# Patient Record
Sex: Female | Born: 1965 | Race: White | Hispanic: No | Marital: Married | Smoking: Never smoker
Health system: Southern US, Community
[De-identification: ages and names within clinical notes are randomized; demographics above are authoritative.]

## PROBLEM LIST (undated history)

## (undated) DIAGNOSIS — L709 Acne, unspecified: Secondary | ICD-10-CM

## (undated) DIAGNOSIS — M1990 Unspecified osteoarthritis, unspecified site: Secondary | ICD-10-CM

## (undated) HISTORY — PX: KNEE ARTHROSCOPY: SUR90

## (undated) HISTORY — PX: ABDOMINAL HYSTERECTOMY: SHX81

---

## 2007-05-12 ENCOUNTER — Ambulatory Visit: Payer: Self-pay | Admitting: Family Medicine

## 2007-05-24 ENCOUNTER — Ambulatory Visit: Payer: Self-pay | Admitting: Family Medicine

## 2007-11-25 ENCOUNTER — Ambulatory Visit: Payer: Self-pay | Admitting: Family Medicine

## 2008-05-25 ENCOUNTER — Ambulatory Visit: Payer: Self-pay | Admitting: Family Medicine

## 2009-08-22 ENCOUNTER — Ambulatory Visit: Payer: Self-pay | Admitting: Family Medicine

## 2010-10-28 ENCOUNTER — Ambulatory Visit: Payer: Self-pay | Admitting: Family Medicine

## 2011-01-06 ENCOUNTER — Ambulatory Visit: Payer: Self-pay | Admitting: Obstetrics and Gynecology

## 2011-01-12 ENCOUNTER — Inpatient Hospital Stay: Payer: Self-pay | Admitting: Obstetrics and Gynecology

## 2011-01-15 LAB — PATHOLOGY REPORT

## 2012-01-13 ENCOUNTER — Ambulatory Visit: Payer: Self-pay | Admitting: Family Medicine

## 2013-01-16 ENCOUNTER — Ambulatory Visit: Payer: Self-pay | Admitting: Family Medicine

## 2014-03-27 ENCOUNTER — Ambulatory Visit: Payer: Self-pay | Admitting: Family Medicine

## 2014-10-01 ENCOUNTER — Encounter: Payer: Self-pay | Admitting: *Deleted

## 2014-10-01 ENCOUNTER — Ambulatory Visit
Admission: RE | Admit: 2014-10-01 | Discharge: 2014-10-01 | Disposition: A | Discharge status: Home / Self Care | Payer: No Typology Code available for payment source | Source: Ambulatory Visit | Attending: Unknown Physician Specialty | Admitting: Unknown Physician Specialty

## 2014-10-01 ENCOUNTER — Ambulatory Visit: Payer: No Typology Code available for payment source | Admitting: *Deleted

## 2014-10-01 ENCOUNTER — Encounter
Admission: RE | Disposition: A | Discharge status: Home / Self Care | Payer: Self-pay | Source: Ambulatory Visit | Attending: Unknown Physician Specialty

## 2014-10-01 DIAGNOSIS — D123 Benign neoplasm of transverse colon: Secondary | ICD-10-CM | POA: Diagnosis not present

## 2014-10-01 DIAGNOSIS — Z79899 Other long term (current) drug therapy: Secondary | ICD-10-CM | POA: Insufficient documentation

## 2014-10-01 DIAGNOSIS — Z1211 Encounter for screening for malignant neoplasm of colon: Secondary | ICD-10-CM | POA: Insufficient documentation

## 2014-10-01 DIAGNOSIS — D124 Benign neoplasm of descending colon: Secondary | ICD-10-CM | POA: Diagnosis not present

## 2014-10-01 DIAGNOSIS — K640 First degree hemorrhoids: Secondary | ICD-10-CM | POA: Insufficient documentation

## 2014-10-01 HISTORY — PX: COLONOSCOPY WITH PROPOFOL: SHX5780

## 2014-10-01 SURGERY — COLONOSCOPY WITH PROPOFOL
Anesthesia: General

## 2014-10-01 MED ORDER — SODIUM CHLORIDE 0.9 % IV SOLN: INTRAVENOUS | Status: DC

## 2014-10-01 MED ORDER — LIDOCAINE HCL 1 % IJ SOLN
INTRAMUSCULAR | Status: DC | PRN
  Administered 2014-10-01: 50 mg via INTRADERMAL

## 2014-10-01 MED ORDER — PROPOFOL 10 MG/ML IV BOLUS
INTRAVENOUS | Status: DC | PRN
  Administered 2014-10-01 (×2): 50 mg via INTRAVENOUS

## 2014-10-01 MED ORDER — SODIUM CHLORIDE 0.9 % IV SOLN
INTRAVENOUS | Status: DC | PRN
  Administered 2014-10-01: 16:00:00 via INTRAVENOUS

## 2014-10-01 MED ORDER — PROPOFOL INFUSION 10 MG/ML OPTIME
INTRAVENOUS | Status: DC | PRN
  Administered 2014-10-01: 200 ug/kg/min via INTRAVENOUS

## 2014-10-01 NOTE — Anesthesia Postprocedure Evaluation (Signed)
  Anesthesia Post-op Note  Patient: Danielle Berry  Procedure(s) Performed: Procedure(s): COLONOSCOPY WITH PROPOFOL (N/A)  Anesthesia type:General  Patient location: PACU  Post pain: Pain level controlled  Post assessment: Post-op Vital signs reviewed, Patient's Cardiovascular Status Stable, Respiratory Function Stable, Patent Airway and No signs of Nausea or vomiting  Post vital signs: Reviewed and stable  Last Vitals:  Filed Vitals:   10/01/14 1740  BP: 112/72  Pulse: 56  Temp:   Resp: 10    Level of consciousness: awake, alert  and patient cooperative  Complications: No apparent anesthesia complications

## 2014-10-01 NOTE — H&P (Signed)
Primary Care Physician:  Juluis Pitch, MD Primary Gastroenterologist:  Dr. Vira Agar  Pre-Procedure History & Physical: HPI:  BEMNET TROVATO is a 49 y.o. female is here for an colonoscopy.   History reviewed. No pertinent past medical history.  History reviewed. No pertinent past surgical history.  Prior to Admission medications   Medication Sig Start Date End Date Taking? Authorizing Provider  cetirizine (ZYRTEC) 10 MG tablet Take 10 mg by mouth daily.   Yes Historical Provider, MD  doxycycline (VIBRAMYCIN) 100 MG capsule Take 100 mg by mouth 2 (two) times daily.   Yes Historical Provider, MD  spironolactone (ALDACTONE) 100 MG tablet Take 100 mg by mouth daily.   Yes Historical Provider, MD  tretinoin (RETIN-A) 0.025 % cream Apply topically at bedtime.   Yes Historical Provider, MD    Allergies as of 09/04/2014  . (Not on File)    History reviewed. No pertinent family history.  History   Social History  . Marital Status: Married    Spouse Name: N/A  . Number of Children: N/A  . Years of Education: N/A   Occupational History  . Not on file.   Social History Main Topics  . Smoking status: Not on file  . Smokeless tobacco: Not on file  . Alcohol Use: Not on file  . Drug Use: Not on file  . Sexual Activity: Not on file   Other Topics Concern  . Not on file   Social History Narrative  . No narrative on file    Review of Systems: See HPI, otherwise negative ROS  Physical Exam: BP 122/80 mmHg  Pulse 57  Temp(Src) 98.6 F (37 C) (Tympanic)  Resp 14  Ht 6' (1.829 m)  Wt 81.647 kg (180 lb)  BMI 24.41 kg/m2  SpO2 100% General:   Alert,  pleasant and cooperative in NAD Head:  Normocephalic and atraumatic. Neck:  Supple; no masses or thyromegaly. Lungs:  Clear throughout to auscultation.    Heart:  Regular rate and rhythm. Abdomen:  Soft, nontender and nondistended. Normal bowel sounds, without guarding, and without rebound.   Neurologic:  Alert and   oriented x4;  grossly normal neurologically.  Impression/Plan: HARMONI LUCUS is here for an colonoscopy to be performed for screening colonoscopy  Risks, benefits, limitations, and alternatives regarding  colonoscopy have been reviewed with the patient.  Questions have been answered.  All parties agreeable.   Gaylyn Cheers, MD  10/01/2014, 4:29 PM   Primary Care Physician:  Juluis Pitch, MD Primary Gastroenterologist:  Dr. Vira Agar  Pre-Procedure History & Physical: HPI:  JAYLANI MCGUINN is a 49 y.o. female is here for an colonoscopy.   History reviewed. No pertinent past medical history.  History reviewed. No pertinent past surgical history.  Prior to Admission medications   Medication Sig Start Date End Date Taking? Authorizing Provider  cetirizine (ZYRTEC) 10 MG tablet Take 10 mg by mouth daily.   Yes Historical Provider, MD  doxycycline (VIBRAMYCIN) 100 MG capsule Take 100 mg by mouth 2 (two) times daily.   Yes Historical Provider, MD  spironolactone (ALDACTONE) 100 MG tablet Take 100 mg by mouth daily.   Yes Historical Provider, MD  tretinoin (RETIN-A) 0.025 % cream Apply topically at bedtime.   Yes Historical Provider, MD    Allergies as of 09/04/2014  . (Not on File)    History reviewed. No pertinent family history.  History   Social History  . Marital Status: Married    Spouse Name: N/A  .  Number of Children: N/A  . Years of Education: N/A   Occupational History  . Not on file.   Social History Main Topics  . Smoking status: Not on file  . Smokeless tobacco: Not on file  . Alcohol Use: Not on file  . Drug Use: Not on file  . Sexual Activity: Not on file   Other Topics Concern  . Not on file   Social History Narrative  . No narrative on file    Review of Systems: See HPI, otherwise negative ROS  Physical Exam: BP 122/80 mmHg  Pulse 57  Temp(Src) 98.6 F (37 C) (Tympanic)  Resp 14  Ht 6' (1.829 m)  Wt 81.647 kg (180 lb)  BMI 24.41  kg/m2  SpO2 100% General:   Alert,  pleasant and cooperative in NAD Head:  Normocephalic and atraumatic. Neck:  Supple; no masses or thyromegaly. Lungs:  Clear throughout to auscultation.    Heart:  Regular rate and rhythm. Abdomen:  Soft, nontender and nondistended. Normal bowel sounds, without guarding, and without rebound.   Neurologic:  Alert and  oriented x4;  grossly normal neurologically.  Impression/Plan: AASHKA SALOMONE is here for an colonoscopy to be performed for screening colon.  Risks, benefits, limitations, and alternatives regarding  colonoscopy have been reviewed with the patient.  Questions have been answered.  All parties agreeable.   Gaylyn Cheers, MD  10/01/2014, 4:29 PM

## 2014-10-01 NOTE — Op Note (Signed)
[facility] Gastroenterology Patient Name: Danielle Berry Procedure Date: 10/01/2014 4:16 PM MRN: [id number] Account #: [id number] Date of Birth: 1966-03-03 Admit Type: Outpatient Age: 49 Room: [facility] ENDO ROOM 1 Gender: Female Note Status: Finalized Procedure:         Colonoscopy Indications:       Screening for colorectal malignant neoplasm Providers:         Manya Silvas, MD Referring MD:      Youlanda Roys. Lovie Macadamia, MD (Referring MD) Medicines:         Propofol per Anesthesia Complications:     No immediate complications. Procedure:         Pre-Anesthesia Assessment:                    - After reviewing the risks and benefits, the patient was                     deemed in satisfactory condition to undergo the procedure.                    After obtaining informed consent, the colonoscope was                     passed under direct vision. Throughout the procedure, the                     patient's blood pressure, pulse, and oxygen saturations                     were monitored continuously. The Olympus PCF-H180AL                     colonoscope ( S#: [id number] ) was introduced through the                     anus and advanced to the the cecum, identified by                     appendiceal orifice and ileocecal valve. The colonoscopy                     was performed without difficulty. The patient tolerated                     the procedure well. The quality of the bowel preparation                     was adequate to identify polyps. Findings:      A small polyp was found in the transverse colon. The polyp was sessile.       The polyp was removed with a cold snare. Resection and retrieval were       complete.      A diminutive polyp was found at the hepatic flexure. The polyp was       sessile. The polyp was removed with a jumbo cold forceps. Resection and       retrieval were complete.      A diminutive polyp was found in the transverse colon. The polyp was        sessile. The polyp was removed with a jumbo cold forceps. Resection and       retrieval were complete.      A diminutive polyp was found in the descending colon. The polyp was  sessile. The polyp was removed with a cold snare. Resection and       retrieval were complete.      Internal hemorrhoids were found during endoscopy. The hemorrhoids were       small and Grade I (internal hemorrhoids that do not prolapse). Impression:        - One small polyp in the transverse colon. Resected and                     retrieved.                    - One diminutive polyp at the hepatic flexure. Resected                     and retrieved.                    - One diminutive polyp in the transverse colon. Resected                     and retrieved.                    - One diminutive polyp in the descending colon. Resected                     and retrieved.                    - Internal hemorrhoids. Recommendation:    - Await pathology results.                    - The findings and recommendations were discussed with the                     patient's family. Manya Silvas, MD 10/01/2014 5:09:46 PM This report has been signed electronically. Number of Addenda: 0 Note Initiated On: 10/01/2014 4:16 PM Scope Withdrawal Time: 0 hours 16 minutes 26 seconds  Total Procedure Duration: 0 hours 28 minutes 9 seconds       [facility]

## 2014-10-01 NOTE — Transfer of Care (Signed)
Immediate Anesthesia Transfer of Care Note  Patient: Danielle Berry  Procedure(s) Performed: Procedure(s): COLONOSCOPY WITH PROPOFOL (N/A)  Patient Location: PACU  Anesthesia Type:General  Level of Consciousness: awake, alert  and oriented  Airway & Oxygen Therapy: Patient Spontanous Breathing and Patient connected to nasal cannula oxygen  Post-op Assessment: Report given to RN and Post -op Vital signs reviewed and stable  Post vital signs: Reviewed and stable  Last Vitals:  Filed Vitals:   10/01/14 1711  BP: 107/53  Pulse: 65  Temp: 35.7 C  Resp: 14    Complications: No apparent anesthesia complications

## 2014-10-01 NOTE — Anesthesia Preprocedure Evaluation (Signed)
Anesthesia Evaluation  Patient identified by MRN, date of birth, ID band Patient awake    Reviewed: Allergy & Precautions, NPO status , Patient's Chart, lab work & pertinent test results  Airway Mallampati: I  TM Distance: >3 FB Neck ROM: Full    Dental  (+) Teeth Intact   Pulmonary    Pulmonary exam normal       Cardiovascular Exercise Tolerance: Good negative cardio ROS Normal cardiovascular exam    Neuro/Psych    GI/Hepatic   Endo/Other    Renal/GU      Musculoskeletal   Abdominal Normal abdominal exam  (+)   Peds  Hematology   Anesthesia Other Findings   Reproductive/Obstetrics                             Anesthesia Physical Anesthesia Plan  ASA: II  Anesthesia Plan: General   Post-op Pain Management:    Induction: Intravenous  Airway Management Planned: Nasal Cannula  Additional Equipment:   Intra-op Plan:   Post-operative Plan:   Informed Consent: I have reviewed the patients History and Physical, chart, labs and discussed the procedure including the risks, benefits and alternatives for the proposed anesthesia with the patient or authorized representative who has indicated his/her understanding and acceptance.     Plan Discussed with: CRNA  Anesthesia Plan Comments:         Anesthesia Quick Evaluation

## 2014-10-03 LAB — SURGICAL PATHOLOGY

## 2014-10-08 ENCOUNTER — Encounter: Payer: Self-pay | Admitting: Unknown Physician Specialty

## 2015-05-31 ENCOUNTER — Other Ambulatory Visit: Payer: Self-pay | Admitting: Obstetrics and Gynecology

## 2015-05-31 DIAGNOSIS — N6459 Other signs and symptoms in breast: Secondary | ICD-10-CM

## 2015-06-14 ENCOUNTER — Ambulatory Visit
Admission: RE | Admit: 2015-06-14 | Discharge: 2015-06-14 | Disposition: A | Discharge status: Home / Self Care | Payer: BLUE CROSS/BLUE SHIELD | Source: Ambulatory Visit | Attending: Obstetrics and Gynecology | Admitting: Obstetrics and Gynecology

## 2015-06-14 ENCOUNTER — Ambulatory Visit: Payer: No Typology Code available for payment source

## 2015-06-14 DIAGNOSIS — N6459 Other signs and symptoms in breast: Secondary | ICD-10-CM | POA: Insufficient documentation

## 2017-01-08 ENCOUNTER — Other Ambulatory Visit: Payer: Self-pay | Admitting: Obstetrics and Gynecology

## 2017-01-08 ENCOUNTER — Other Ambulatory Visit: Payer: Self-pay | Admitting: Family Medicine

## 2017-01-08 DIAGNOSIS — Z1231 Encounter for screening mammogram for malignant neoplasm of breast: Secondary | ICD-10-CM

## 2017-02-01 ENCOUNTER — Ambulatory Visit
Admission: RE | Admit: 2017-02-01 | Discharge: 2017-02-01 | Disposition: A | Discharge status: Home / Self Care | Payer: BLUE CROSS/BLUE SHIELD | Source: Ambulatory Visit | Attending: Family Medicine | Admitting: Family Medicine

## 2017-02-01 DIAGNOSIS — Z1231 Encounter for screening mammogram for malignant neoplasm of breast: Secondary | ICD-10-CM | POA: Insufficient documentation

## 2018-03-24 ENCOUNTER — Other Ambulatory Visit: Payer: Self-pay | Admitting: Family Medicine

## 2018-03-24 DIAGNOSIS — Z1231 Encounter for screening mammogram for malignant neoplasm of breast: Secondary | ICD-10-CM

## 2018-04-14 ENCOUNTER — Ambulatory Visit
Admission: RE | Admit: 2018-04-14 | Discharge: 2018-04-14 | Disposition: A | Discharge status: Home / Self Care | Payer: PRIVATE HEALTH INSURANCE | Source: Ambulatory Visit | Attending: Family Medicine | Admitting: Family Medicine

## 2018-04-14 DIAGNOSIS — Z1231 Encounter for screening mammogram for malignant neoplasm of breast: Secondary | ICD-10-CM

## 2019-05-23 ENCOUNTER — Other Ambulatory Visit: Payer: Self-pay | Admitting: Family Medicine

## 2019-05-23 DIAGNOSIS — Z1231 Encounter for screening mammogram for malignant neoplasm of breast: Secondary | ICD-10-CM

## 2019-05-25 ENCOUNTER — Ambulatory Visit
Admission: RE | Admit: 2019-05-25 | Discharge: 2019-05-25 | Disposition: A | Discharge status: Home / Self Care | Payer: 59 | Source: Ambulatory Visit | Attending: Family Medicine | Admitting: Family Medicine

## 2019-05-25 DIAGNOSIS — Z1231 Encounter for screening mammogram for malignant neoplasm of breast: Secondary | ICD-10-CM | POA: Diagnosis present

## 2020-05-15 ENCOUNTER — Other Ambulatory Visit: Payer: Self-pay | Admitting: Family Medicine

## 2020-05-15 DIAGNOSIS — Z1231 Encounter for screening mammogram for malignant neoplasm of breast: Secondary | ICD-10-CM

## 2020-06-03 ENCOUNTER — Other Ambulatory Visit: Payer: Self-pay

## 2020-06-03 ENCOUNTER — Ambulatory Visit
Admission: RE | Admit: 2020-06-03 | Discharge: 2020-06-03 | Disposition: A | Discharge status: Home / Self Care | Payer: 59 | Source: Ambulatory Visit | Attending: Family Medicine | Admitting: Family Medicine

## 2020-06-03 DIAGNOSIS — Z1231 Encounter for screening mammogram for malignant neoplasm of breast: Secondary | ICD-10-CM | POA: Diagnosis present

## 2021-05-07 ENCOUNTER — Other Ambulatory Visit: Payer: Self-pay | Admitting: Family Medicine

## 2021-05-07 DIAGNOSIS — Z1231 Encounter for screening mammogram for malignant neoplasm of breast: Secondary | ICD-10-CM

## 2021-06-13 ENCOUNTER — Ambulatory Visit
Admission: RE | Admit: 2021-06-13 | Discharge: 2021-06-13 | Disposition: A | Discharge status: Home / Self Care | Payer: 59 | Source: Ambulatory Visit | Attending: Family Medicine | Admitting: Family Medicine

## 2021-06-13 DIAGNOSIS — Z1231 Encounter for screening mammogram for malignant neoplasm of breast: Secondary | ICD-10-CM | POA: Diagnosis not present

## 2021-06-24 DIAGNOSIS — Z13220 Encounter for screening for lipoid disorders: Secondary | ICD-10-CM | POA: Diagnosis not present

## 2021-06-24 DIAGNOSIS — Z0000 Encounter for general adult medical examination without abnormal findings: Secondary | ICD-10-CM | POA: Diagnosis not present

## 2021-06-24 DIAGNOSIS — L700 Acne vulgaris: Secondary | ICD-10-CM | POA: Diagnosis not present

## 2021-06-24 DIAGNOSIS — M8580 Other specified disorders of bone density and structure, unspecified site: Secondary | ICD-10-CM | POA: Diagnosis not present

## 2021-06-25 DIAGNOSIS — Z13220 Encounter for screening for lipoid disorders: Secondary | ICD-10-CM | POA: Diagnosis not present

## 2021-06-25 DIAGNOSIS — M8580 Other specified disorders of bone density and structure, unspecified site: Secondary | ICD-10-CM | POA: Diagnosis not present

## 2021-06-25 DIAGNOSIS — L700 Acne vulgaris: Secondary | ICD-10-CM | POA: Diagnosis not present

## 2022-05-12 ENCOUNTER — Other Ambulatory Visit: Payer: Self-pay | Admitting: Family Medicine

## 2022-05-12 DIAGNOSIS — Z1231 Encounter for screening mammogram for malignant neoplasm of breast: Secondary | ICD-10-CM

## 2022-06-15 ENCOUNTER — Ambulatory Visit
Admission: RE | Admit: 2022-06-15 | Discharge: 2022-06-15 | Disposition: A | Discharge status: Home / Self Care | Payer: 59 | Source: Ambulatory Visit | Attending: Family Medicine | Admitting: Family Medicine

## 2022-06-15 DIAGNOSIS — Z1231 Encounter for screening mammogram for malignant neoplasm of breast: Secondary | ICD-10-CM | POA: Insufficient documentation

## 2023-02-04 ENCOUNTER — Encounter: Payer: Self-pay | Admitting: Internal Medicine

## 2023-02-23 ENCOUNTER — Ambulatory Visit
Admission: RE | Admit: 2023-02-23 | Discharge: 2023-02-23 | Disposition: A | Discharge status: Home / Self Care | Payer: 59 | Attending: Internal Medicine | Admitting: Internal Medicine

## 2023-02-23 ENCOUNTER — Encounter
Admission: RE | Disposition: A | Discharge status: Home / Self Care | Payer: Self-pay | Source: Home / Self Care | Attending: Internal Medicine

## 2023-02-23 ENCOUNTER — Ambulatory Visit: Payer: 59 | Admitting: Certified Registered"

## 2023-02-23 ENCOUNTER — Encounter: Payer: Self-pay | Admitting: Internal Medicine

## 2023-02-23 DIAGNOSIS — Z1211 Encounter for screening for malignant neoplasm of colon: Secondary | ICD-10-CM | POA: Insufficient documentation

## 2023-02-23 DIAGNOSIS — Z860101 Personal history of adenomatous and serrated colon polyps: Secondary | ICD-10-CM | POA: Diagnosis not present

## 2023-02-23 DIAGNOSIS — K640 First degree hemorrhoids: Secondary | ICD-10-CM | POA: Diagnosis not present

## 2023-02-23 HISTORY — DX: Acne, unspecified: L70.9

## 2023-02-23 HISTORY — PX: COLONOSCOPY WITH PROPOFOL: SHX5780

## 2023-02-23 HISTORY — DX: Unspecified osteoarthritis, unspecified site: M19.90

## 2023-02-23 SURGERY — COLONOSCOPY WITH PROPOFOL
Anesthesia: General

## 2023-02-23 MED ORDER — SODIUM CHLORIDE 0.9 % IV SOLN: INTRAVENOUS | Status: DC | PRN

## 2023-02-23 MED ORDER — PROPOFOL 10 MG/ML IV BOLUS
INTRAVENOUS | Status: DC | PRN
  Administered 2023-02-23: 50 mg via INTRAVENOUS

## 2023-02-23 MED ORDER — PROPOFOL 1000 MG/100ML IV EMUL
INTRAVENOUS | Status: AC
  Filled 2023-02-23: qty 100

## 2023-02-23 MED ORDER — PROPOFOL 500 MG/50ML IV EMUL
INTRAVENOUS | Status: DC | PRN
  Administered 2023-02-23: 150 ug/kg/min via INTRAVENOUS

## 2023-02-23 NOTE — Transfer of Care (Signed)
Immediate Anesthesia Transfer of Care Note  Patient: Danielle Berry  Procedure(s) Performed: COLONOSCOPY WITH PROPOFOL  Patient Location: PACU  Anesthesia Type:General  Level of Consciousness: awake, alert , and oriented  Airway & Oxygen Therapy: Patient Spontanous Breathing and Patient connected to face mask oxygen  Post-op Assessment: Report given to RN, Post -op Vital signs reviewed and stable, and Patient moving all extremities X 4  Post vital signs: Reviewed and stable  Last Vitals:  Vitals Value Taken Time  BP 83/55   Temp    Pulse 59 02/23/23 1118  Resp 14 02/23/23 1118  SpO2 100 % 02/23/23 1118  Vitals shown include unfiled device data.  Last Pain:  Vitals:   02/23/23 1013  TempSrc: Temporal         Complications: No notable events documented.

## 2023-02-23 NOTE — Interval H&P Note (Signed)
History and Physical Interval Note:  02/23/2023 10:51 AM  Danielle Berry  has presented today for surgery, with the diagnosis of Z86.010 (ICD-10-CM) - History of colon polyps.  The various methods of treatment have been discussed with the patient and family. After consideration of risks, benefits and other options for treatment, the patient has consented to  Procedure(s): COLONOSCOPY WITH PROPOFOL (N/A) as a surgical intervention.  The patient's history has been reviewed, patient examined, no change in status, stable for surgery.  I have reviewed the patient's chart and labs.  Questions were answered to the patient's satisfaction.     [place], [place]

## 2023-02-23 NOTE — Op Note (Signed)
[facility] Gastroenterology Patient Name: Danielle Berry Procedure Date: 02/23/2023 10:58 AM MRN: [id number] Account #: [id number] Date of Birth: [birth year] Admit Type: Outpatient Age: 57 Room: [facility] ENDO ROOM 1 Gender: Female Note Status: Finalized Instrument Name: Prentice Docker [id number] Procedure:             Colonoscopy Indications:           High risk colon cancer surveillance: Personal history                         of non-advanced adenoma Providers:             Boykin Nearing. Winry Egnew MD, MD Medicines:             Propofol per Anesthesia Complications:         No immediate complications. Estimated blood loss: None. Procedure:             Pre-Anesthesia Assessment:                        - The risks and benefits of the procedure and the                         sedation options and risks were discussed with the                         patient. All questions were answered and informed                         consent was obtained.                        - Patient identification and proposed procedure were                         verified prior to the procedure by the nurse. The                         procedure was verified in the pre-procedure area in                         the procedure room.                        - ASA Grade Assessment: III - A patient with severe                         systemic disease.                        - After reviewing the risks and benefits, the patient                         was deemed in satisfactory condition to undergo the                         procedure.                        After obtaining informed consent, the colonoscope was  passed under direct vision. Throughout the procedure,                         the patient's blood pressure, pulse, and oxygen                         saturations were monitored continuously. The                         Colonoscope was introduced through the anus and                          advanced to the the cecum, identified by appendiceal                         orifice and ileocecal valve. The colonoscopy was                         performed without difficulty. The patient tolerated                         the procedure well. The quality of the bowel                         preparation was good. The ileocecal valve, appendiceal                         orifice, and rectum were photographed. Findings:      The perianal and digital rectal examinations were normal. Pertinent       negatives include normal sphincter tone and no palpable rectal lesions.      Non-bleeding internal hemorrhoids were found during retroflexion. The       hemorrhoids were Grade I (internal hemorrhoids that do not prolapse).      The exam was otherwise without abnormality. Impression:            - Non-bleeding internal hemorrhoids.                        - The examination was otherwise normal.                        - No specimens collected. Recommendation:        - Patient has a contact number available for                         emergencies. The signs and symptoms of potential                         delayed complications were discussed with the patient.                         Return to normal activities tomorrow. Written                         discharge instructions were provided to the patient.                        - Resume previous diet.                        -  Continue present medications.                        - Repeat colonoscopy in 5 years for screening purposes.                        - Family history of colon polyps in two first degree                         relatives.                        - Return to GI office PRN.                        - The findings and recommendations were discussed with                         the patient. Procedure Code(s):     --- Professional ---                        W0981, Colorectal cancer screening; colonoscopy on                          individual at high risk Diagnosis Code(s):     --- Professional ---                        K64.0, First degree hemorrhoids                        Z86.010, Personal history of colonic polyps CPT copyright 2022 American Medical Association. All rights reserved. The codes documented in this report are preliminary and upon coder review may  be revised to meet current compliance requirements. Stanton Kidney MD, MD 02/23/2023 11:19:47 AM This report has been signed electronically. Number of Addenda: 0 Note Initiated On: 02/23/2023 10:58 AM Scope Withdrawal Time: 0 hours 5 minutes 21 seconds  Total Procedure Duration: 0 hours 11 minutes 18 seconds  Estimated Blood Loss:  Estimated blood loss: none.      [facility]

## 2023-02-23 NOTE — Anesthesia Postprocedure Evaluation (Signed)
Anesthesia Post Note  Patient: Danielle Berry  Procedure(s) Performed: COLONOSCOPY WITH PROPOFOL  Patient location during evaluation: Endoscopy Anesthesia Type: General Level of consciousness: awake and alert Pain management: pain level controlled Vital Signs Assessment: post-procedure vital signs reviewed and stable Respiratory status: spontaneous breathing, nonlabored ventilation, respiratory function stable and patient connected to nasal cannula oxygen Cardiovascular status: blood pressure returned to baseline and stable Postop Assessment: no apparent nausea or vomiting Anesthetic complications: no   No notable events documented.   Last Vitals:  Vitals:   02/23/23 1128 02/23/23 1139  BP: 104/70 115/77  Pulse: 60   Resp: 12 (!) 9  Temp:    SpO2: 100%     Last Pain:  Vitals:   02/23/23 1128  TempSrc:   PainSc: 0-No pain                 Cleda Mccreedy Dayonna Selbe

## 2023-02-23 NOTE — Anesthesia Procedure Notes (Signed)
Procedure Name: MAC Date/Time: 02/23/2023 10:59 AM  Performed by: Nelle Don, CRNAPre-anesthesia Checklist: Patient identified, Emergency Drugs available, Patient being monitored and Suction available Oxygen Delivery Method: Nasal cannula

## 2023-02-23 NOTE — Anesthesia Preprocedure Evaluation (Signed)
Anesthesia Evaluation  Patient identified by MRN, date of birth, ID band Patient awake    Reviewed: Allergy & Precautions, NPO status , Patient's Chart, lab work & pertinent test results  History of Anesthesia Complications Negative for: history of anesthetic complications  Airway Mallampati: II  TM Distance: >3 FB Neck ROM: full    Dental  (+) Chipped   Pulmonary neg pulmonary ROS, neg shortness of breath   Pulmonary exam normal        Cardiovascular Exercise Tolerance: Good (-) angina (-) Orthopnea negative cardio ROS Normal cardiovascular exam     Neuro/Psych negative neurological ROS  negative psych ROS   GI/Hepatic negative GI ROS, Neg liver ROS,neg GERD  ,,  Endo/Other  negative endocrine ROS    Renal/GU negative Renal ROS  negative genitourinary   Musculoskeletal   Abdominal   Peds  Hematology negative hematology ROS (+)   Anesthesia Other Findings Past Medical History: No date: Acne No date: Arthritis  Past Surgical History: No date: ABDOMINAL HYSTERECTOMY 10/01/2014: COLONOSCOPY WITH PROPOFOL; N/A     Comment:  Procedure: COLONOSCOPY WITH PROPOFOL;  Surgeon: Scot Jun, MD;  Location: [facility] ENDOSCOPY;  Service:               Endoscopy;  Laterality: N/A; No date: KNEE ARTHROSCOPY; Right     Reproductive/Obstetrics negative OB ROS                             Anesthesia Physical Anesthesia Plan  ASA: 2  Anesthesia Plan: General   Post-op Pain Management:    Induction: Intravenous  PONV Risk Score and Plan: Propofol infusion and TIVA  Airway Management Planned: Natural Airway and Nasal Cannula  Additional Equipment:   Intra-op Plan:   Post-operative Plan:   Informed Consent: I have reviewed the patients History and Physical, chart, labs and discussed the procedure including the risks, benefits and alternatives for the proposed anesthesia  with the patient or authorized representative who has indicated his/her understanding and acceptance.     Dental Advisory Given  Plan Discussed with: Anesthesiologist, CRNA and Surgeon  Anesthesia Plan Comments: (Patient consented for risks of anesthesia including but not limited to:  - adverse reactions to medications - risk of airway placement if required - damage to eyes, teeth, lips or other oral mucosa - nerve damage due to positioning  - sore throat or hoarseness - Damage to heart, brain, nerves, lungs, other parts of body or loss of life  Patient voiced understanding and assent.)       Anesthesia Quick Evaluation

## 2023-02-23 NOTE — H&P (Signed)
Outpatient short stay form Pre-procedure 02/23/2023 10:50 AM Danielle Berry K. Norma Fredrickson, M.D.  Primary Physician: Dorothey Baseman, M.D.    Reason for visit:  Hx adenomatous colon polyps - 2016  History of present illness: Danielle Berry reports overall well from GI standpoint. She generally is having a bowel movement most days. She denies any issues with significant constipation, diarrhea, melena, rectal bleeding. No frequent lower abdominal pain.     No current facility-administered medications for this encounter.  Medications Prior to Admission  Medication Sig Dispense Refill Last Dose   doxycycline (VIBRAMYCIN) 100 MG capsule Take 100 mg by mouth 2 (two) times daily.   02/22/2023   spironolactone (ALDACTONE) 100 MG tablet Take 100 mg by mouth daily.   02/22/2023   cetirizine (ZYRTEC) 10 MG tablet Take 10 mg by mouth daily.      tretinoin (RETIN-A) 0.025 % cream Apply topically at bedtime.        No Known Allergies   Past Medical History:  Diagnosis Date   Acne    Arthritis     Review of systems:  Otherwise negative.    Physical Exam  Gen: Alert, oriented. Appears stated age.  HEENT: [place]/AT. PERRLA. Lungs: CTA, no wheezes. CV: RR nl S1, S2. Abd: soft, benign, no masses. BS+ Ext: No edema. Pulses 2+    Planned procedures: Proceed with colonoscopy. The patient understands the nature of the planned procedure, indications, risks, alternatives and potential complications including but not limited to bleeding, infection, perforation, damage to internal organs and possible oversedation/side effects from anesthesia. The patient agrees and gives consent to proceed.  Please refer to procedure notes for findings, recommendations and patient disposition/instructions.     Dino Borntreger K. Norma Fredrickson, M.D. Gastroenterology 02/23/2023  10:50 AM

## 2023-09-15 IMAGING — MG MM DIGITAL SCREENING BILAT W/ TOMO AND CAD
6 of 10 series · 6 of 30 positions shown · non-contrast
Comparison: Previous exam(s).

CLINICAL DATA: Screening.

EXAM:
DIGITAL SCREENING BILATERAL MAMMOGRAM WITH TOMOSYNTHESIS AND CAD
TECHNIQUE: Bilateral screening digital craniocaudal and mediolateral oblique
mammograms were obtained. Bilateral screening digital breast
tomosynthesis was performed. The images were evaluated with
computer-aided detection.

[R CC synth-2D]
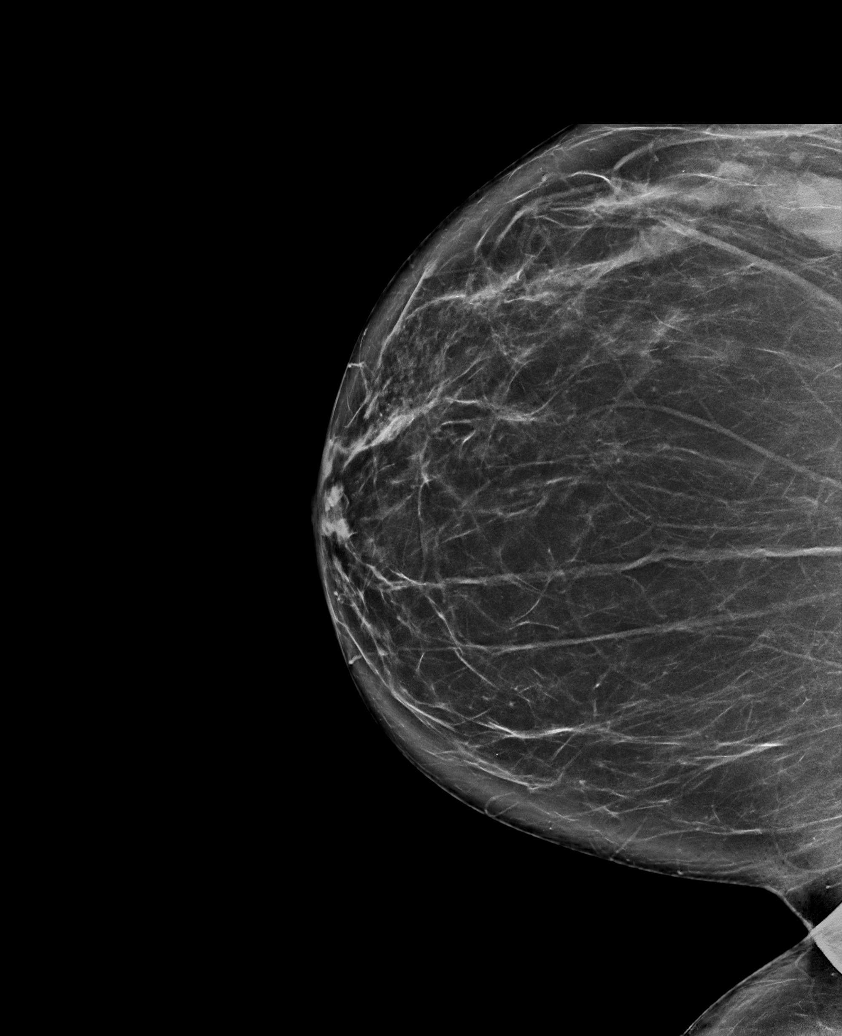

[L MLO synth-2D]
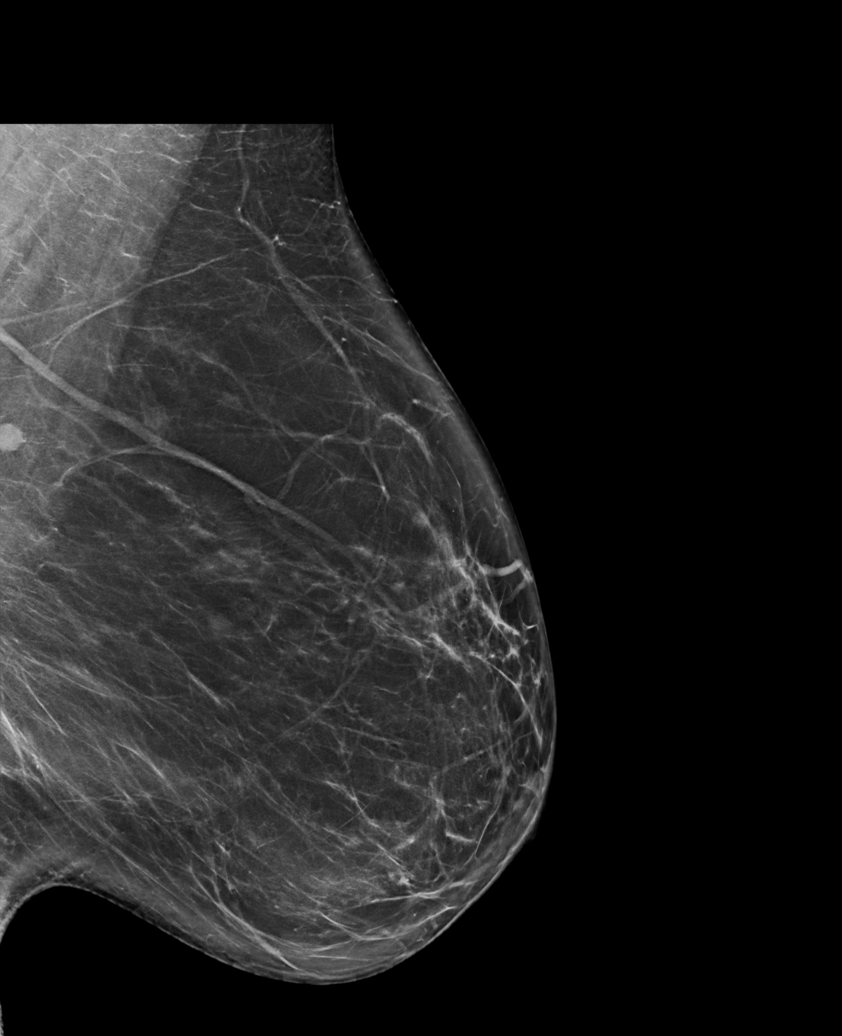

[R MLO synth-2D]
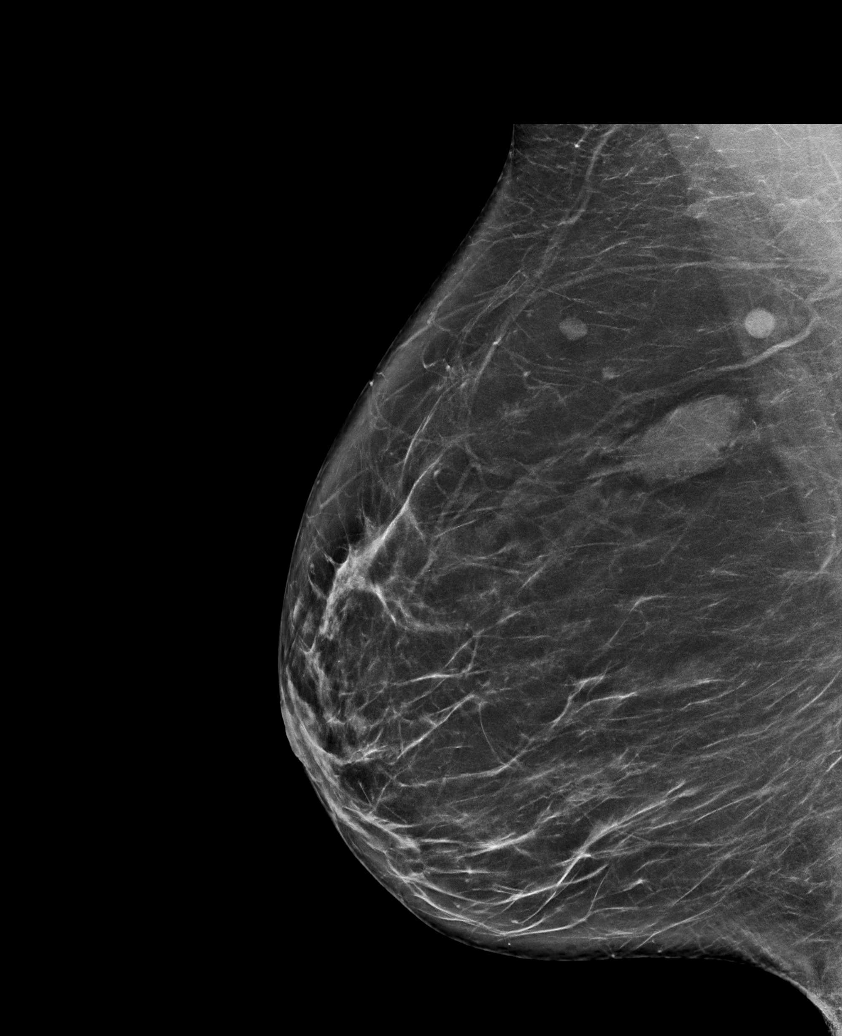

[R XCCL synth-2D]
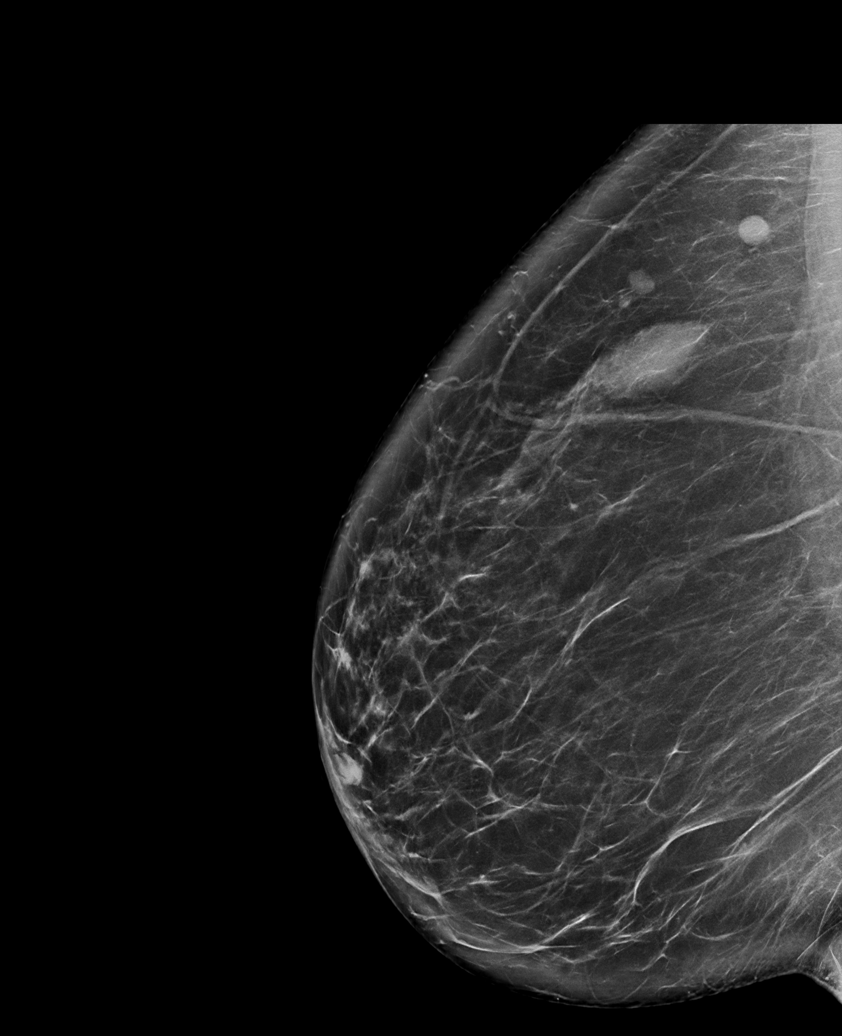

[L CC synth-2D]
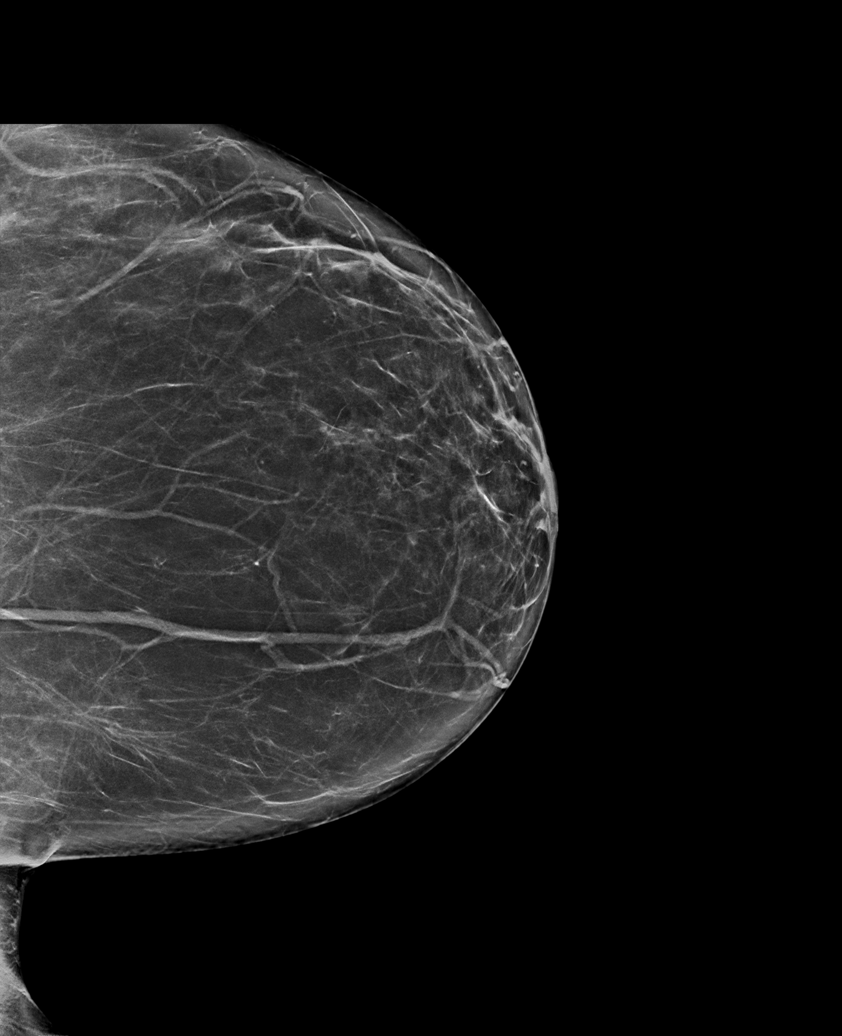

[R CC tomo · tomo slice 41/80.0]
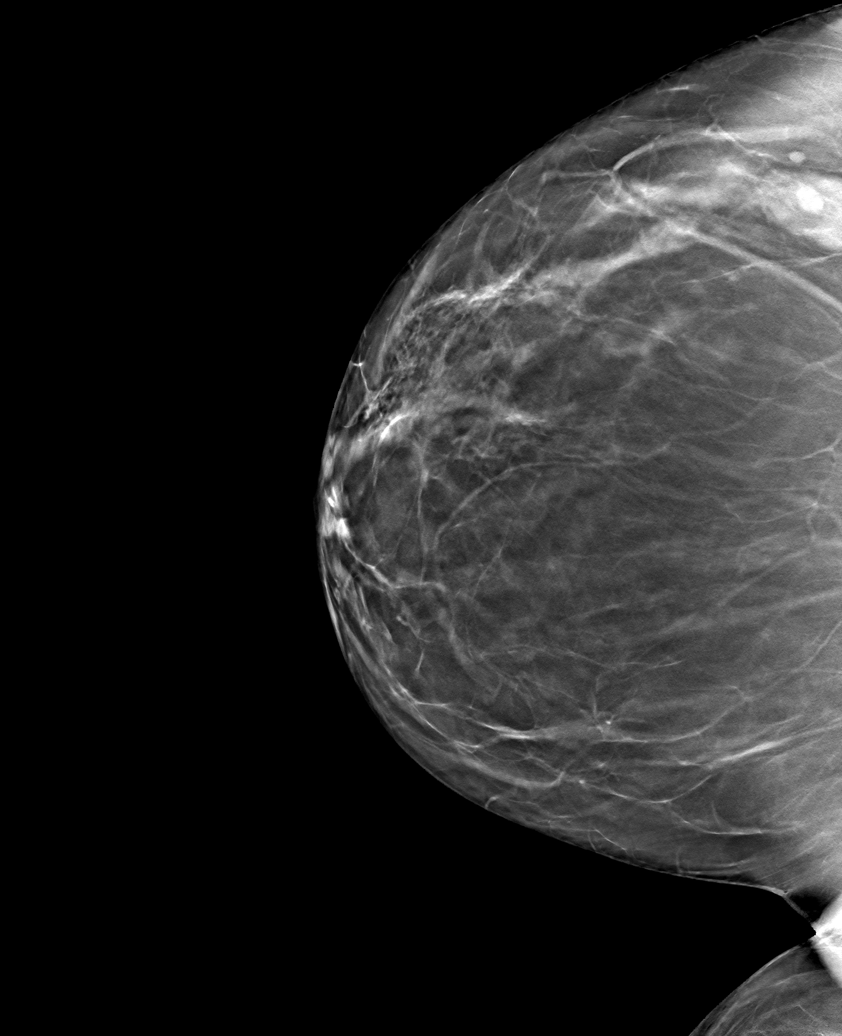

[6 of 30 positions shown; findings below may reference images not displayed]

ACR Breast Density Category b: There are scattered areas of
fibroglandular density.
FINDINGS: There are no findings suspicious for malignancy.
IMPRESSION: No mammographic evidence of malignancy. A result letter of this
screening mammogram will be mailed directly to the patient.

RECOMMENDATION:
Screening mammogram in one year. (Code:51-O-LD2)

BI-RADS CATEGORY  1: Negative.

## 2023-09-21 ENCOUNTER — Other Ambulatory Visit: Payer: Self-pay | Admitting: Family Medicine

## 2023-09-21 DIAGNOSIS — Z1231 Encounter for screening mammogram for malignant neoplasm of breast: Secondary | ICD-10-CM

## 2023-10-08 ENCOUNTER — Ambulatory Visit
Admission: RE | Admit: 2023-10-08 | Discharge: 2023-10-08 | Disposition: A | Discharge status: Home / Self Care | Source: Ambulatory Visit | Attending: Family Medicine | Admitting: Family Medicine

## 2023-10-08 DIAGNOSIS — Z1231 Encounter for screening mammogram for malignant neoplasm of breast: Secondary | ICD-10-CM | POA: Insufficient documentation
